# Patient Record
Sex: Male | Born: 1980 | Race: Black or African American | Hispanic: No | Marital: Single | Smoking: Former smoker
Health system: Southern US, Community
[De-identification: ages and names within clinical notes are randomized; demographics above are authoritative.]

## PROBLEM LIST (undated history)

## (undated) DIAGNOSIS — I10 Essential (primary) hypertension: Secondary | ICD-10-CM

## (undated) DIAGNOSIS — K319 Disease of stomach and duodenum, unspecified: Secondary | ICD-10-CM

## (undated) HISTORY — DX: Disease of stomach and duodenum, unspecified: K31.9

## (undated) HISTORY — DX: Essential (primary) hypertension: I10

---

## 2005-02-09 ENCOUNTER — Emergency Department (HOSPITAL_COMMUNITY): Admission: EM | Admit: 2005-02-09 | Discharge: 2005-02-10 | Payer: Self-pay | Admitting: Emergency Medicine

## 2006-01-12 ENCOUNTER — Emergency Department (HOSPITAL_COMMUNITY): Admission: EM | Admit: 2006-01-12 | Discharge: 2006-01-13 | Payer: Self-pay | Admitting: Emergency Medicine

## 2006-01-14 ENCOUNTER — Emergency Department (HOSPITAL_COMMUNITY): Admission: EM | Admit: 2006-01-14 | Discharge: 2006-01-14 | Payer: Self-pay | Admitting: Emergency Medicine

## 2013-03-08 ENCOUNTER — Encounter (HOSPITAL_BASED_OUTPATIENT_CLINIC_OR_DEPARTMENT_OTHER): Payer: Self-pay

## 2013-03-08 ENCOUNTER — Emergency Department (HOSPITAL_BASED_OUTPATIENT_CLINIC_OR_DEPARTMENT_OTHER)
Admission: EM | Admit: 2013-03-08 | Discharge: 2013-03-08 | Disposition: A | Discharge status: Home / Self Care | Payer: Self-pay | Attending: Emergency Medicine | Admitting: Emergency Medicine

## 2013-03-08 ENCOUNTER — Emergency Department (HOSPITAL_BASED_OUTPATIENT_CLINIC_OR_DEPARTMENT_OTHER): Payer: Self-pay

## 2013-03-08 DIAGNOSIS — F17200 Nicotine dependence, unspecified, uncomplicated: Secondary | ICD-10-CM | POA: Insufficient documentation

## 2013-03-08 DIAGNOSIS — R079 Chest pain, unspecified: Secondary | ICD-10-CM

## 2013-03-08 DIAGNOSIS — R42 Dizziness and giddiness: Secondary | ICD-10-CM | POA: Insufficient documentation

## 2013-03-08 DIAGNOSIS — R0789 Other chest pain: Secondary | ICD-10-CM | POA: Insufficient documentation

## 2013-03-08 LAB — COMPREHENSIVE METABOLIC PANEL
ALT: 23 U/L (ref 0–53)
AST: 23 U/L (ref 0–37)
Albumin: 3.6 g/dL (ref 3.5–5.2)
Alkaline Phosphatase: 74 U/L (ref 39–117)
GFR calc Af Amer: >90 mL/min (ref >90)
Glucose, Bld: 127 mg/dL — ABNORMAL HIGH (ref 70–99)
Potassium: 3.4 mEq/L — ABNORMAL LOW (ref 3.5–5.1)
Sodium: 141 mEq/L (ref 135–145)
Total Protein: 7.2 g/dL (ref 6.0–8.3)

## 2013-03-08 LAB — CBC WITH DIFFERENTIAL/PLATELET
Basophils Absolute: 0 10*3/uL (ref 0.0–0.1)
Eosinophils Absolute: 0.2 10*3/uL (ref 0.0–0.7)
Lymphs Abs: 2.4 10*3/uL (ref 0.7–4.0)
MCH: 27.4 pg (ref 26.0–34.0)
Neutrophils Relative %: 48 % (ref 43–77)
Platelets: 254 10*3/uL (ref 150–400)
RBC: 5.29 MIL/uL (ref 4.22–5.81)
RDW: 13.9 % (ref 11.5–15.5)
WBC: 5.9 10*3/uL (ref 4.0–10.5)

## 2013-03-08 NOTE — ED Notes (Addendum)
C/o lightheaded, dizziness and CP started while driving home after [id number] gait into triage/talking on cell phone-NAD

## 2013-03-08 NOTE — ED Provider Notes (Signed)
History     CSN: [id number]  Arrival date & time 03/08/13  1946   First MD Initiated Contact with Patient 03/08/13 2059      Chief Complaint  Patient presents with  . Dizziness  . Chest Pain    (Consider location/radiation/quality/duration/timing/severity/associated sxs/prior treatment) HPI Comments: Patient started with feelings of dizziness, chest discomfort for the past several hours.  It started while driving home from work but has since resolved.  No fever, chills or cough.    Patient is a 32 y.o. male presenting with chest pain. The history is provided by the patient.  Chest Pain Pain location:  Substernal area Pain quality: pressure   Pain radiates to:  Does not radiate Pain radiates to the back: no   Pain severity:  Mild Onset quality:  Sudden Duration:  1 hour Timing:  Constant Progression:  Resolved Chronicity:  New Relieved by:  Nothing Worsened by:  Nothing tried Ineffective treatments:  None tried   History reviewed. No pertinent past medical history.  History reviewed. No pertinent past surgical history.  No family history on file.  History  Substance Use Topics  . Smoking status: Current Every Day Smoker  . Smokeless tobacco: Not on file  . Alcohol Use: No      Review of Systems  Cardiovascular: Positive for chest pain.  All other systems reviewed and are negative.    Allergies  Review of patient's allergies indicates no known allergies.  Home Medications  No current outpatient prescriptions on file.  BP 176/85  Pulse 71  Temp(Src) 98.2 F (36.8 C) (Oral)  Resp 20  Ht 5\' 5"  (1.651 m)  Wt 245 lb (111.131 kg)  BMI 40.77 kg/m2  SpO2 98%  Physical Exam  Nursing note and vitals reviewed. Constitutional: He is oriented to person, place, and time. He appears well-developed and well-nourished. No distress.  HENT:  Head: Normocephalic and atraumatic.  Mouth/Throat: Oropharynx is clear and moist.  Neck: Normal range of motion. Neck  supple.  Cardiovascular: Normal rate, regular rhythm and normal heart sounds.   No murmur heard. Pulmonary/Chest: Effort normal and breath sounds normal. No respiratory distress. He has no wheezes. He has no rales. He exhibits no tenderness.  Abdominal: Soft. Bowel sounds are normal.  Musculoskeletal: Normal range of motion. He exhibits no edema.  Neurological: He is alert and oriented to person, place, and time.  Skin: Skin is warm and dry. He is not diaphoretic.    ED Course  Procedures (including critical care time)  Labs Reviewed  CBC WITH DIFFERENTIAL  COMPREHENSIVE METABOLIC PANEL  TROPONIN I   No results found.   No diagnosis found.   Date: 03/08/2013  Rate: 69  Rhythm: normal sinus rhythm  QRS Axis: normal  Intervals: normal  ST/T Wave abnormalities: normal  Conduction Disutrbances:none  Narrative Interpretation:   Old EKG Reviewed: none available    MDM  The labs, ekg, and chest xray are unremarkable.  The complaints do not sound cardiac in nature.  I suspect a musculoskeletal etiology.  Will discharge to home with nsaids, return prn.        Geoffery Lyons, MD 03/08/13 2234

## 2013-03-08 NOTE — ED Notes (Signed)
Patient transported to X-ray 

## 2013-08-02 ENCOUNTER — Emergency Department (HOSPITAL_COMMUNITY)
Admission: EM | Admit: 2013-08-02 | Discharge: 2013-08-02 | Disposition: A | Discharge status: Home / Self Care | Payer: Self-pay | Attending: Emergency Medicine | Admitting: Emergency Medicine

## 2013-08-02 ENCOUNTER — Emergency Department (HOSPITAL_COMMUNITY): Payer: Self-pay

## 2013-08-02 ENCOUNTER — Encounter (HOSPITAL_COMMUNITY): Payer: Self-pay | Admitting: Emergency Medicine

## 2013-08-02 DIAGNOSIS — Z7982 Long term (current) use of aspirin: Secondary | ICD-10-CM | POA: Insufficient documentation

## 2013-08-02 DIAGNOSIS — R062 Wheezing: Secondary | ICD-10-CM | POA: Insufficient documentation

## 2013-08-02 DIAGNOSIS — R0789 Other chest pain: Secondary | ICD-10-CM | POA: Insufficient documentation

## 2013-08-02 DIAGNOSIS — F17200 Nicotine dependence, unspecified, uncomplicated: Secondary | ICD-10-CM | POA: Insufficient documentation

## 2013-08-02 LAB — CBC
HCT: 43.3 % (ref 39.0–52.0)
Hemoglobin: 15.2 g/dL (ref 13.0–17.0)
MCH: 28.1 pg (ref 26.0–34.0)
MCHC: 35.1 g/dL (ref 30.0–36.0)
MCV: 80 fL (ref 78.0–100.0)

## 2013-08-02 LAB — BASIC METABOLIC PANEL
BUN: 10 mg/dL (ref 6–23)
CO2: 25 mEq/L (ref 19–32)
Calcium: 9.6 mg/dL (ref 8.4–10.5)
Creatinine, Ser: 0.99 mg/dL (ref 0.50–1.35)
GFR calc non Af Amer: >90 mL/min (ref >90)
Glucose, Bld: 113 mg/dL — ABNORMAL HIGH (ref 70–99)

## 2013-08-02 MED ORDER — ALBUTEROL SULFATE (5 MG/ML) 0.5% IN NEBU
5.0000 mg | INHALATION_SOLUTION | Freq: Once | RESPIRATORY_TRACT | Status: AC
  Administered 2013-08-02: 5 mg via RESPIRATORY_TRACT
  Filled 2013-08-02: qty 1

## 2013-08-02 NOTE — ED Provider Notes (Signed)
CSN: [id number]     Arrival date & time 08/02/13  1708 History   First MD Initiated Contact with Patient 08/02/13 2150     Chief Complaint  Patient presents with  . Chest Pain   (Consider location/radiation/quality/duration/timing/severity/associated sxs/prior Treatment) HPI Comments: 32 yo with smoking hx, no blood clot or cardiac hx presents with intermittent chest tightness for two weeks.  Non exertional, no diaphoresis.  No specific worsening features.  Non radiating. No lung dz.  Lasts seconds to a few minutes.  Patient denies blood clot history, active cancer, recent major trauma or surgery, unilateral leg swelling/ pain, recent long travel, hemoptysis or oral contraceptives. No cp for the past 3 hrs.    Patient is a 32 y.o. male presenting with chest pain. The history is provided by the patient.  Chest Pain Associated symptoms: no abdominal pain, no back pain, no cough, no fever, no headache, no shortness of breath and not vomiting     History reviewed. No pertinent past medical history. History reviewed. No pertinent past surgical history. History reviewed. No pertinent family history. History  Substance Use Topics  . Smoking status: Current Every Day Smoker  . Smokeless tobacco: Not on file  . Alcohol Use: No    Review of Systems  Constitutional: Negative for fever and chills.  HENT: Negative for congestion.   Eyes: Negative for visual disturbance.  Respiratory: Positive for wheezing. Negative for cough and shortness of breath.   Cardiovascular: Positive for chest pain.  Gastrointestinal: Negative for vomiting and abdominal pain.  Genitourinary: Negative for dysuria and flank pain.  Musculoskeletal: Negative for back pain, neck pain and neck stiffness.  Skin: Negative for rash.  Neurological: Negative for light-headedness and headaches.    Allergies  Review of patient's allergies indicates no known allergies.  Home Medications   Current Outpatient Rx  Name   Route  Sig  Dispense  Refill  . aspirin 81 MG tablet   Oral   Take 81 mg by mouth daily.          BP 142/80  Pulse 73  Temp(Src) 97.7 F (36.5 C) (Oral)  Resp 20  Wt 244 lb 1 oz (110.706 kg)  BMI 40.61 kg/m2  SpO2 95% Physical Exam  Nursing note and vitals reviewed. Constitutional: He is oriented to person, place, and time. He appears well-developed and well-nourished.  HENT:  Head: Normocephalic and atraumatic.  Eyes: Conjunctivae are normal. Right eye exhibits no discharge. Left eye exhibits no discharge.  Neck: Normal range of motion. Neck supple. No tracheal deviation present.  Cardiovascular: Normal rate, regular rhythm and intact distal pulses.   No murmur heard. Pulmonary/Chest: Effort normal. He has wheezes (mild exp bilateral).  Abdominal: Soft. He exhibits no distension. There is no tenderness. There is no guarding.  Musculoskeletal: He exhibits no edema and no tenderness.  Neurological: He is alert and oriented to person, place, and time. No cranial nerve deficit.  Skin: Skin is warm. No rash noted.  Psychiatric: He has a normal mood and affect.    ED Course  Procedures (including critical care time) Labs Review Labs Reviewed  CBC  BASIC METABOLIC PANEL  POCT I-STAT TROPONIN I   Imaging Review Dg Chest 2 View (if Patient Has Fever And/or Copd)  08/02/2013   CLINICAL DATA:  Chest pain, shortness of Breath.  EXAM: CHEST  2 VIEW  COMPARISON:  03/08/2013  FINDINGS: The heart size and mediastinal contours are within normal limits. Both lungs are clear. The visualized  skeletal structures are unremarkable. No effusion.  IMPRESSION: No active cardiopulmonary disease.   Electronically Signed   By: Oley Balm M.D.   On: 08/02/2013 18:06    EKG Interpretation     Ventricular Rate:  77 PR Interval:  132 QRS Duration: 82 QT Interval:  402 QTC Calculation: 454 R Axis:   73 Text Interpretation:  Normal sinus rhythm Normal ECG No acute findings, similar  previous            MDM  No diagnosis found. Well appearing.  Very atypical cp.  Very low risk cad, very low risk PE. No indication for further eval at this time in ED. Close fup outpt, if continues pt may need stress test.  EKG no acute findings.  Results and differential diagnosis were discussed with the patient. Close follow up outpatient was discussed, patient comfortable with the plan.   Diagnosis: Chest tightness   Enid Skeens, MD 08/02/13 2214

## 2013-08-02 NOTE — ED Notes (Signed)
Presents with intermittent chest pain and dizziness for the past 2 weeks associated with SOB and chest tightness, generalized to upper chest. Pain does not radiate. Nothing makes pain better, nothing makses pain worse. Bilateral expiratory wheezes auscultated.  Denies pain at this time. Recently quit smoking.

## 2014-03-29 IMAGING — CR DG CHEST 2V
2 series · 2 of 2 positions shown · non-contrast
Comparison: 03/08/2013

CLINICAL DATA: Chest pain, shortness of Breath.

EXAM:
CHEST  2 VIEW

[w chest pa]
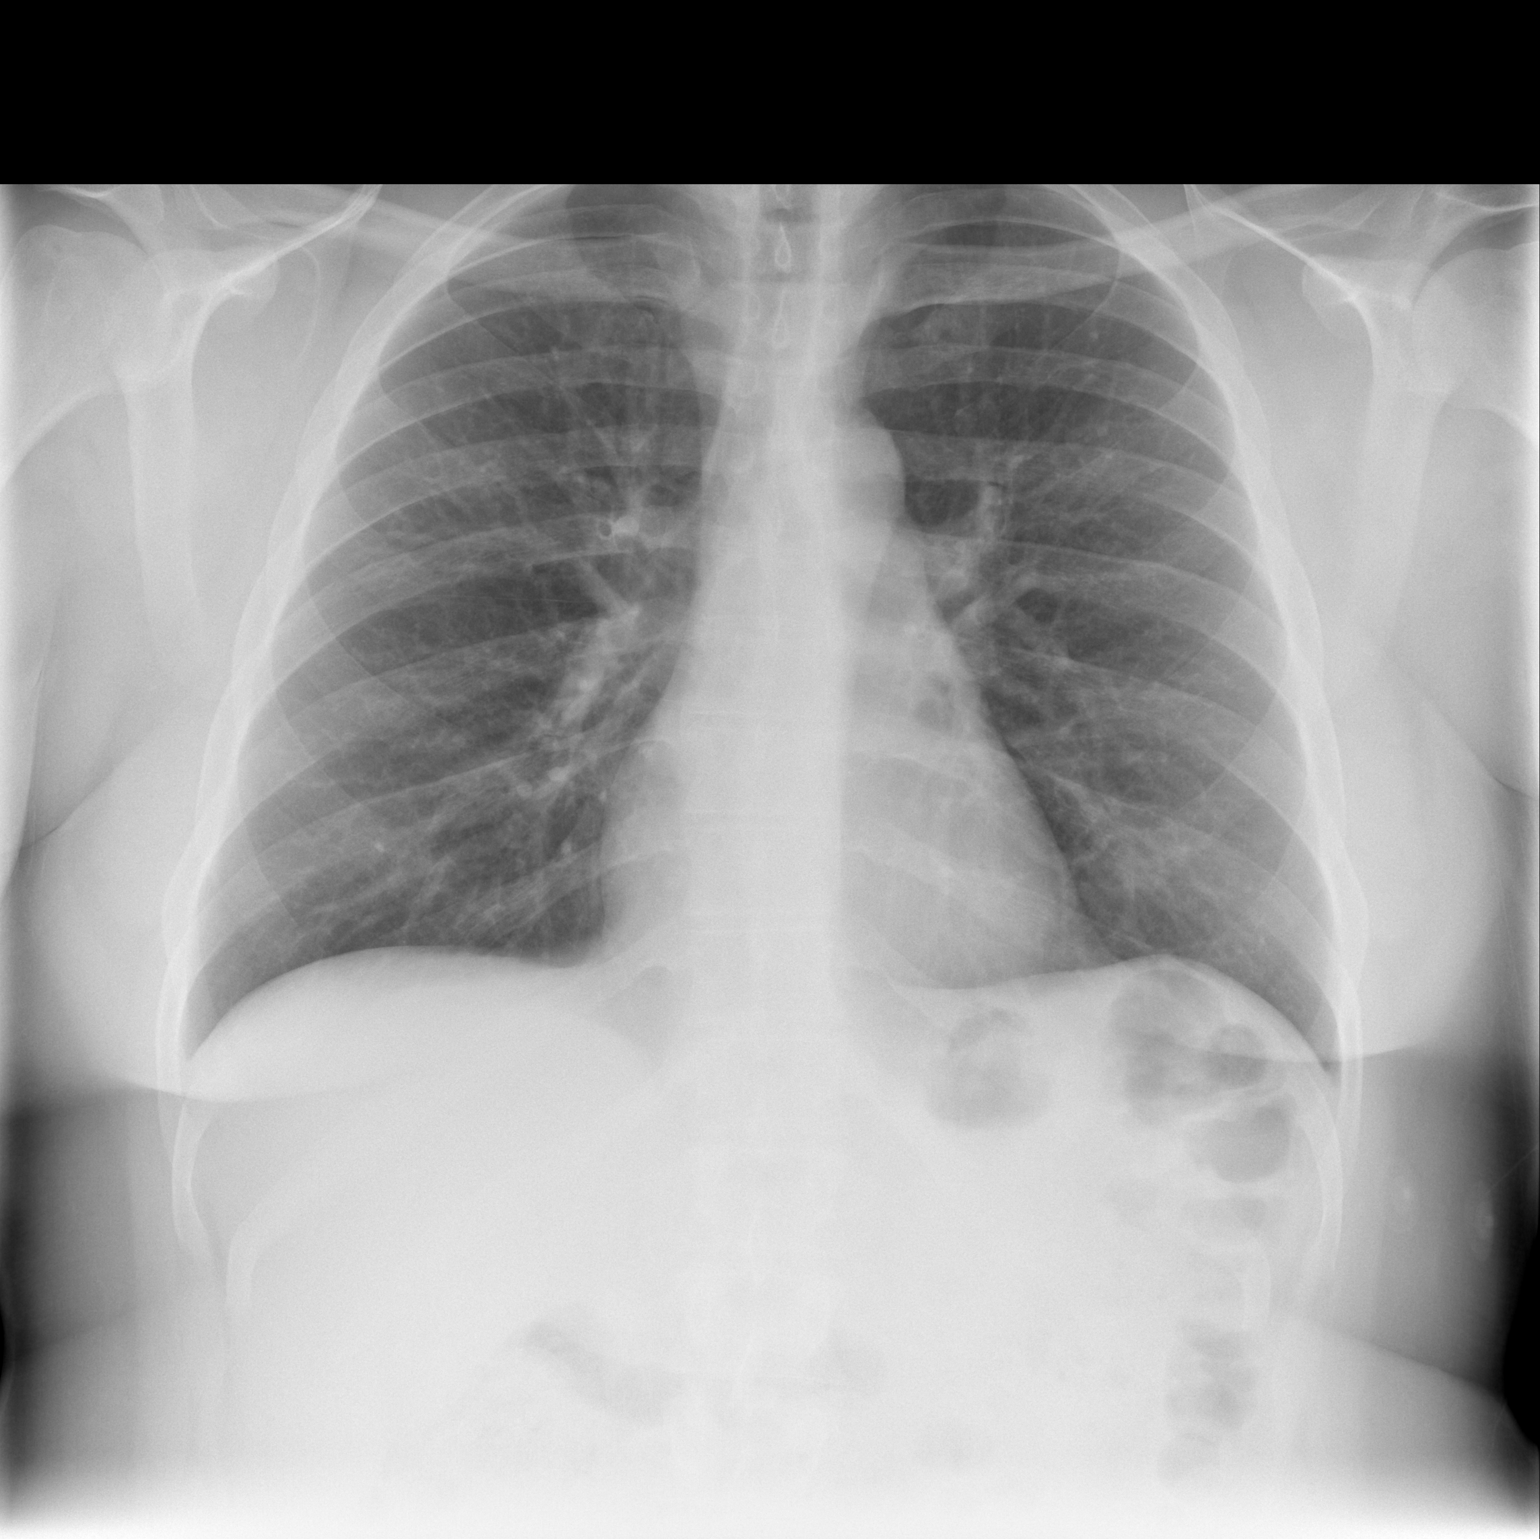

[w chest lat]
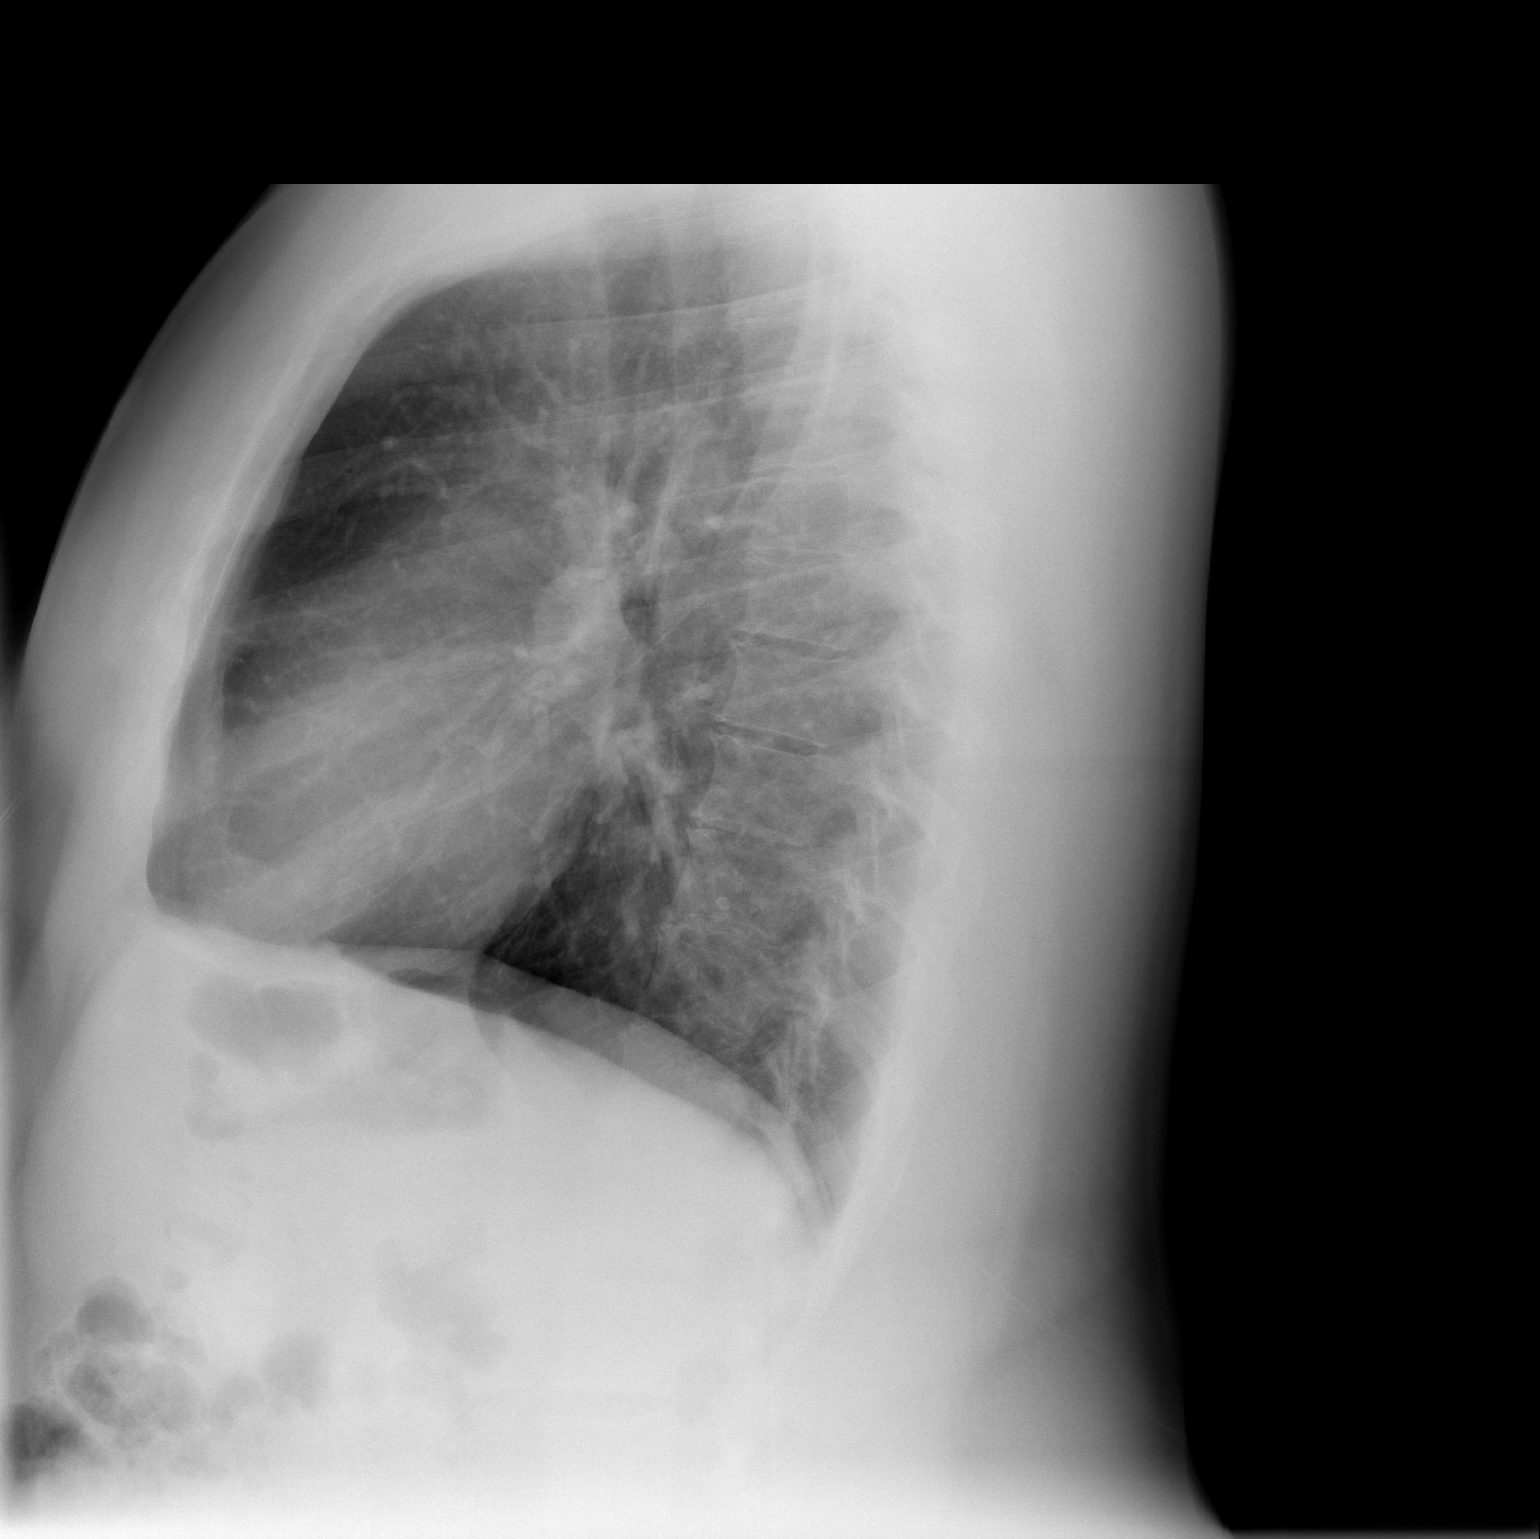

[2 of 2 positions shown; findings below may reference images not displayed]

FINDINGS: The heart size and mediastinal contours are within normal limits.
Both lungs are clear. The visualized skeletal structures are
unremarkable. No effusion.
IMPRESSION: No active cardiopulmonary disease.

## 2014-11-16 ENCOUNTER — Encounter (HOSPITAL_COMMUNITY): Payer: Self-pay | Admitting: Emergency Medicine

## 2014-11-16 ENCOUNTER — Emergency Department (HOSPITAL_COMMUNITY)
Admission: EM | Admit: 2014-11-16 | Discharge: 2014-11-16 | Disposition: A | Discharge status: Home / Self Care | Payer: No Typology Code available for payment source | Source: Home / Self Care | Attending: Family Medicine | Admitting: Family Medicine

## 2014-11-16 DIAGNOSIS — R42 Dizziness and giddiness: Secondary | ICD-10-CM

## 2014-11-16 NOTE — ED Notes (Addendum)
Pt states that he started feeling dizzy 2 hours ago along with some hyperventilation.

## 2014-11-16 NOTE — ED Provider Notes (Signed)
Lonnie Moore is a 34 y.o. male who presents to Urgent Care today for dizziness. Patient developed vague dizziness today. He describes an unstable feeling. He denies any vertigo blurry vision weakness or numbness. No presyncopal feelings. He denies any significant palpitations. He does note a week and a half of mild chest tightness. He denies any exertional chest pain or radiating pain. No fevers or chills. He has not tried any medications yet. He no longer is experiencing any dizziness chest pain or palpitations.   History reviewed. No pertinent past medical history. History reviewed. No pertinent past surgical history. History  Substance Use Topics  . Smoking status: Current Every Day Smoker  . Smokeless tobacco: Not on file  . Alcohol Use: No   ROS as above Medications: No current facility-administered medications for this encounter.   Current Outpatient Prescriptions  Medication Sig Dispense Refill  . aspirin 81 MG tablet Take 81 mg by mouth daily.     No Known Allergies   Exam:  BP 166/97 mmHg  Pulse 80  Temp(Src) 98.5 F (36.9 C) (Oral)  Resp 20  SpO2 99% Gen: Well NAD HEENT: EOMI,  MMM PERRLA Lungs: Normal work of breathing. CTABL Heart: RRR no MRG Abd: NABS, Soft. Nondistended, Nontender Exts: Brisk capillary refill, warm and well perfused. Neuro: Alert and oriented cranial nerves II through XII are intact normal coordination balance and sensation. Normal gait.   12-lead EKG shows sinus rhythm at 65 bpm. No ST segment elevation or depression. QTC 409. Normal-appearing EKG. no significant change from prior EKG  No results found for this or any previous visit (from the past 24 hour(s)). No results found.  Assessment and Plan: 34 y.o. male with vague dizziness and chest pain. Patient appears to be well. His EKG is essentially normal. Follow-up with cardiology for further risk factor stratification. Recommend follow-up with PCP in the near future.  Discussed  warning signs or symptoms. Please see discharge instructions. Patient expresses understanding.     Rodolph BongEvan S Daina Cara, MD 11/16/14 [phone]

## 2014-11-16 NOTE — Discharge Instructions (Signed)
Thank you for coming in today. Follow-up with cardiology Call and schedule an appointment Go to the emergency room if you get worse Call or go to the emergency room if you get worse, have trouble breathing, have chest pains, or palpitations.    Dizziness Dizziness is a common problem. It is a feeling of unsteadiness or light-headedness. You may feel like you are about to faint. Dizziness can lead to injury if you stumble or fall. A person of any age group can suffer from dizziness, but dizziness is more common in older adults. CAUSES  Dizziness can be caused by many different things, including:  Middle ear problems.  Standing for too long.  Infections.  An allergic reaction.  Aging.  An emotional response to something, such as the sight of blood.  Side effects of medicines.  Tiredness.  Problems with circulation or blood pressure.  Excessive use of alcohol or medicines, or illegal drug use.  Breathing too fast (hyperventilation).  An irregular heart rhythm (arrhythmia).  A low red blood cell count (anemia).  Pregnancy.  Vomiting, diarrhea, fever, or other illnesses that cause body fluid loss (dehydration).  Diseases or conditions such as Parkinson's disease, high blood pressure (hypertension), diabetes, and thyroid problems.  Exposure to extreme heat. DIAGNOSIS  Your health care provider will ask about your symptoms, perform a physical exam, and perform an electrocardiogram (ECG) to record the electrical activity of your heart. Your health care provider may also perform other heart or blood tests to determine the cause of your dizziness. These may include:  Transthoracic echocardiogram (TTE). During echocardiography, sound waves are used to evaluate how blood flows through your heart.  Transesophageal echocardiogram (TEE).  Cardiac monitoring. This allows your health care provider to monitor your heart rate and rhythm in real time.  Holter monitor. This is a  portable device that records your heartbeat and can help diagnose heart arrhythmias. It allows your health care provider to track your heart activity for several days if needed.  Stress tests by exercise or by giving medicine that makes the heart beat faster. TREATMENT  Treatment of dizziness depends on the cause of your symptoms and can vary greatly. HOME CARE INSTRUCTIONS   Drink enough fluids to keep your urine clear or pale yellow. This is especially important in very hot weather. In older adults, it is also important in cold weather.  Take your medicine exactly as directed if your dizziness is caused by medicines. When taking blood pressure medicines, it is especially important to get up slowly.  Rise slowly from chairs and steady yourself until you feel okay.  In the morning, first sit up on the side of the bed. When you feel okay, stand slowly while holding onto something until you know your balance is fine.  Move your legs often if you need to stand in one place for a long time. Tighten and relax your muscles in your legs while standing.  Have someone stay with you for 1-2 days if dizziness continues to be a problem. Do this until you feel you are well enough to stay alone. Have the person call your health care provider if he or she notices changes in you that are concerning.  Do not drive or use heavy machinery if you feel dizzy.  Do not drink alcohol. SEEK IMMEDIATE MEDICAL CARE IF:   Your dizziness or light-headedness gets worse.  You feel nauseous or vomit.  You have problems talking, walking, or using your arms, hands, or legs.  You feel weak.  You are not thinking clearly or you have trouble forming sentences. It may take a friend or family member to notice this.  You have chest pain, abdominal pain, shortness of breath, or sweating.  Your vision changes.  You notice any bleeding.  You have side effects from medicine that seems to be getting worse rather than  better. MAKE SURE YOU:   Understand these instructions.  Will watch your condition.  Will get help right away if you are not doing well or get worse. Document Released: 03/18/2001 Document Revised: 09/27/2013 Document Reviewed: 04/11/2011 [facility] Patient Information 2015 [place], [place]. This information is not intended to replace advice given to you by your health care provider. Make sure you discuss any questions you have with your health care provider.  Chest Pain (Nonspecific) It is often hard to give a specific diagnosis for the cause of chest pain. There is always a chance that your pain could be related to something serious, such as a heart attack or a blood clot in the lungs. You need to follow up with your health care provider for further evaluation. CAUSES   Heartburn.  Pneumonia or bronchitis.  Anxiety or stress.  Inflammation around your heart (pericarditis) or lung (pleuritis or pleurisy).  A blood clot in the lung.  A collapsed lung (pneumothorax). It can develop suddenly on its own (spontaneous pneumothorax) or from trauma to the chest.  Shingles infection (herpes zoster virus). The chest wall is composed of bones, muscles, and cartilage. Any of these can be the source of the pain.  The bones can be bruised by injury.  The muscles or cartilage can be strained by coughing or overwork.  The cartilage can be affected by inflammation and become sore (costochondritis). DIAGNOSIS  Lab tests or other studies may be needed to find the cause of your pain. Your health care provider may have you take a test called an ambulatory electrocardiogram (ECG). An ECG records your heartbeat patterns over a 24-hour period. You may also have other tests, such as:  Transthoracic echocardiogram (TTE). During echocardiography, sound waves are used to evaluate how blood flows through your heart.  Transesophageal echocardiogram (TEE).  Cardiac monitoring. This allows your health care  provider to monitor your heart rate and rhythm in real time.  Holter monitor. This is a portable device that records your heartbeat and can help diagnose heart arrhythmias. It allows your health care provider to track your heart activity for several days, if needed.  Stress tests by exercise or by giving medicine that makes the heart beat faster. TREATMENT   Treatment depends on what may be causing your chest pain. Treatment may include:  Acid blockers for heartburn.  Anti-inflammatory medicine.  Pain medicine for inflammatory conditions.  Antibiotics if an infection is present.  You may be advised to change lifestyle habits. This includes stopping smoking and avoiding alcohol, caffeine, and chocolate.  You may be advised to keep your head raised (elevated) when sleeping. This reduces the chance of acid going backward from your stomach into your esophagus. Most of the time, nonspecific chest pain will improve within 2-3 days with rest and mild pain medicine.  HOME CARE INSTRUCTIONS   If antibiotics were prescribed, take them as directed. Finish them even if you start to feel better.  For the next few days, avoid physical activities that bring on chest pain. Continue physical activities as directed.  Do not use any tobacco products, including cigarettes, chewing tobacco, or electronic cigarettes.  Avoid drinking alcohol.  Only take medicine as directed by your health care provider.  Follow your health care provider's suggestions for further testing if your chest pain does not go away.  Keep any follow-up appointments you made. If you do not go to an appointment, you could develop lasting (chronic) problems with pain. If there is any problem keeping an appointment, call to reschedule. SEEK MEDICAL CARE IF:   Your chest pain does not go away, even after treatment.  You have a rash with blisters on your chest.  You have a fever. SEEK IMMEDIATE MEDICAL CARE IF:   You have  increased chest pain or pain that spreads to your arm, neck, jaw, back, or abdomen.  You have shortness of breath.  You have an increasing cough, or you cough up blood.  You have severe back or abdominal pain.  You feel nauseous or vomit.  You have severe weakness.  You faint.  You have chills. This is an emergency. Do not wait to see if the pain will go away. Get medical help at once. Call your local emergency services (911 in [place] Do not drive yourself to the hospital. MAKE SURE YOU:   Understand these instructions.  Will watch your condition.  Will get help right away if you are not doing well or get worse. Document Released: 07/02/2005 Document Revised: 09/27/2013 Document Reviewed: 04/27/2008 [facility] Patient Information 2015 [place], [place]. This information is not intended to replace advice given to you by your health care provider. Make sure you discuss any questions you have with your health care provider.

## 2014-12-12 ENCOUNTER — Institutional Professional Consult (permissible substitution): Payer: No Typology Code available for payment source | Admitting: Internal Medicine

## 2014-12-12 ENCOUNTER — Ambulatory Visit (INDEPENDENT_AMBULATORY_CARE_PROVIDER_SITE_OTHER): Payer: No Typology Code available for payment source | Admitting: Internal Medicine

## 2014-12-12 ENCOUNTER — Encounter: Payer: Self-pay | Admitting: Internal Medicine

## 2014-12-12 VITALS — BP 138/90 | HR 61 | Ht 65.0 in | Wt 245.7 lb

## 2014-12-12 DIAGNOSIS — R42 Dizziness and giddiness: Secondary | ICD-10-CM | POA: Insufficient documentation

## 2014-12-12 DIAGNOSIS — R0789 Other chest pain: Secondary | ICD-10-CM

## 2014-12-12 DIAGNOSIS — F419 Anxiety disorder, unspecified: Secondary | ICD-10-CM

## 2014-12-12 DIAGNOSIS — K219 Gastro-esophageal reflux disease without esophagitis: Secondary | ICD-10-CM

## 2014-12-12 NOTE — Progress Notes (Signed)
OFFICE NOTE  Chief Complaint:  Follow-up urgent care visit  Primary Care Physician: No PCP Per Patient  HPI:  Lonnie Moore is a pleasant 34 year old male who was seen in February at urgent care for an episode of dizziness and some associated chest discomfort. He reports the dizziness is transient and was not a sensation like the room was spinning. He denies any presyncope or syncopal episodes. With regards to his chest discomfort he says it is sometimes worse after meals and worse when laying down at night. The symptoms tend to be in the center of the chest and radiated to both sides and oftentimes to the back. He said it does feel somewhat like his heartburn symptoms he's had and does get relief with taking Pepto-Bismol. He has not been on any long-term antacid medications. Recently he's been trying to decrease spicy foods and fatty foods in his diet. He denies any chest pain with exertion. In fact he's been moving to a different residence over the past several weeks and has been lifting a lot of heavy boxes and furniture without any chest pain or worsening shortness of breath. EKG in the office today is normal.  PMHx:  Past Medical History  Diagnosis Date  . Hypertension   . Stomach problems     No past surgical history on file.  FAMHx:  Family History  Problem Relation Age of Onset  . Stroke Father     SOCHx:   reports that he quit smoking about 16 months ago. His smoking use included Cigarettes. He has a 5 pack-year smoking history. He does not have any smokeless tobacco history on file. He reports that he does not drink alcohol. His drug history is not on file.  ALLERGIES:  No Known Allergies  ROS: A comprehensive review of systems was negative except for: Cardiovascular: positive for chest pain Neurological: positive for dizziness  HOME MEDS: Current Outpatient Prescriptions  Medication Sig Dispense Refill  . aspirin 81 MG tablet Take 81 mg by mouth daily.      No current facility-administered medications for this visit.    LABS/IMAGING: No results found for this or any previous visit (from the past 48 hour(s)). No results found.  VITALS: BP 138/90 mmHg  Pulse 61  Ht 5\' 5"  (1.651 m)  Wt 245 lb 11.2 oz (111.449 kg)  BMI 40.89 kg/m2  EXAM: General appearance: alert and no distress Neck: no JVD and supple, symmetrical, trachea midline Lungs: clear to auscultation bilaterally Heart: regular rate and rhythm, S1, S2 normal, no murmur, click, rub or gallop Abdomen: soft, non-tender; bowel sounds normal; no masses,  no organomegaly Extremities: extremities normal, atraumatic, no cyanosis or edema Pulses: 2+ and symmetric Skin: Skin color, texture, turgor normal. No rashes or lesions Neurologic: Alert and oriented X 3, normal strength and tone. Normal symmetric reflexes. Normal coordination and gait Psych: Pleasant  EKG: Normal sinus rhythm at 61  ASSESSMENT: 1. Atypical chest pain, likely GERD 2. Anxiety 3. Transient dizziness  PLAN: 1.   Mr. Briant SitesGoods had some transient dizziness which is worsened by anxiety and atypical chest discomfort which most likely is GERD. I counseled him on ways to decrease his reflux including avoiding certain foods, staying upright or walking after eating, not eating late at night and going to sleep, increasing his exercise and working on weight loss. None of his symptoms sound like angina and are not associated with heavy exertion. He could also consider over-the-counter Maalox or Pepcid/Zantac as needed for  heartburn symptoms.  I'm happy to see him back on an as-needed basis, however no further cardiac testing is necessary at this time.  Chrystie Nose, MD, [facility] Attending Cardiologist [facility]  Jacobo Moncrief C 12/12/2014, 12:45 PM

## 2014-12-12 NOTE — Patient Instructions (Signed)
Your physician recommends that you schedule a follow-up appointment as needed  

## 2015-09-26 ENCOUNTER — Encounter (HOSPITAL_COMMUNITY): Payer: Self-pay | Admitting: *Deleted

## 2015-09-26 ENCOUNTER — Emergency Department (HOSPITAL_COMMUNITY)
Admission: EM | Admit: 2015-09-26 | Discharge: 2015-09-26 | Disposition: A | Discharge status: Home / Self Care | Payer: No Typology Code available for payment source | Attending: Emergency Medicine | Admitting: Emergency Medicine

## 2015-09-26 DIAGNOSIS — I10 Essential (primary) hypertension: Secondary | ICD-10-CM | POA: Insufficient documentation

## 2015-09-26 DIAGNOSIS — K0889 Other specified disorders of teeth and supporting structures: Secondary | ICD-10-CM | POA: Insufficient documentation

## 2015-09-26 DIAGNOSIS — Z87891 Personal history of nicotine dependence: Secondary | ICD-10-CM | POA: Insufficient documentation

## 2015-09-26 DIAGNOSIS — Z8719 Personal history of other diseases of the digestive system: Secondary | ICD-10-CM | POA: Insufficient documentation

## 2015-09-26 DIAGNOSIS — Z7982 Long term (current) use of aspirin: Secondary | ICD-10-CM | POA: Insufficient documentation

## 2015-09-26 DIAGNOSIS — K002 Abnormalities of size and form of teeth: Secondary | ICD-10-CM | POA: Insufficient documentation

## 2015-09-26 MED ORDER — PENICILLIN V POTASSIUM 500 MG PO TABS: 500.0000 mg | ORAL_TABLET | Freq: Four times a day (QID) | ORAL | Status: AC

## 2015-09-26 NOTE — ED Notes (Signed)
See PA assessment 

## 2015-09-26 NOTE — Discharge Instructions (Signed)

## 2015-09-26 NOTE — ED Provider Notes (Signed)
CSN: [id number]     Arrival date & time 09/26/15  1530 History  By signing my name below, I, Lonnie Moore, attest that this documentation has been prepared under the direction and in the presence of Roxy Horseman, PA-C. Electronically Signed: Budd Moore, ED Scribe. 09/26/2015. 4:10 PM.    Chief Complaint  Patient presents with  . Dental Pain   The history is provided by the patient. No language interpreter was used.   HPI Comments: Lonnie Moore is a 34 y.o. male with a PMHx of HTN who presents to the Emergency Department complaining of constant, aching, lower right-sided dental pain onset 3 days ago, worsening significantly as of yesterday. He currently rates his pain as a 7.5/10. He reports associated swelling to the gums, as well as a feeling of warmth to the right ear. He states he has tried taking tylenol with relief for 5-10 minutes at the most. He notes he does not have a Education officer, community or insurance. Pt denies fever and chills.  Pt has NKDA.  Past Medical History  Diagnosis Date  . Hypertension   . Stomach problems    History reviewed. No pertinent past surgical history. Family History  Problem Relation Age of Onset  . Stroke Father    Social History  Substance Use Topics  . Smoking status: Former Smoker -- 0.50 packs/day for 10 years    Types: Cigarettes    Quit date: 07/31/2013  . Smokeless tobacco: None  . Alcohol Use: No    Review of Systems  Constitutional: Negative for fever and chills.  HENT: Positive for dental problem.     Allergies  Review of patient's allergies indicates no known allergies.  Home Medications   Prior to Admission medications   Medication Sig Start Date End Date Taking? Authorizing Provider  aspirin 81 MG tablet Take 81 mg by mouth daily.    Historical Provider, MD  penicillin v potassium (VEETID) 500 MG tablet Take 1 tablet (500 mg total) by mouth 4 (four) times daily. 09/26/15   Roxy Horseman, PA-C   BP 165/97 mmHg   Pulse 72  Temp(Src) 98.1 F (36.7 C) (Oral)  Resp 18  SpO2 96% Physical Exam  Constitutional: He is oriented to person, place, and time. He appears well-developed and well-nourished.  HENT:  Head: Normocephalic and atraumatic.  Mouth/Throat:    Poor dentition throughout.  Affected tooth as diagrammed.  No signs of peritonsillar or tonsillar abscess.  No signs of gingival abscess. Oropharynx is clear and without exudates.  Uvula is midline.  Airway is intact. No signs of Ludwig's angina with palpation of oral and sublingual mucosa.  Eyes: Conjunctivae are normal. Right eye exhibits no discharge. Left eye exhibits no discharge.  Pulmonary/Chest: Effort normal. No respiratory distress.  Neurological: He is alert and oriented to person, place, and time. Coordination normal.  Skin: Skin is warm and dry. No rash noted. He is not diaphoretic. No erythema.  Psychiatric: He has a normal mood and affect.  Nursing note and vitals reviewed.   ED Course  Procedures  DIAGNOSTIC STUDIES: Oxygen Saturation is 96% on RA, adequate by my interpretation.    COORDINATION OF CARE: 4:05 PM - Discussed plans to order antibiotics. Will refer to a dentist. Pt advised of plan for treatment and pt agrees.   MDM   Final diagnoses:  Pain, dental  Patient with toothache.  No gross abscess.  Exam unconcerning for Ludwig's angina or spread of infection.  Will treat with penicillin and OTC  pain medicine.  Urged patient to follow-up with dentist.    I personally performed the services described in this documentation, which was scribed in my presence. The recorded information has been reviewed and is accurate.     Roxy Horsemanobert Paydon Carll, PA-C 09/26/15 1612  Lyndal Pulleyaniel Knott, MD 09/27/15 [phone]

## 2015-09-26 NOTE — ED Notes (Signed)
Pt reports dental abscess to right tooth, having pain into jaw and ear. Denies fever. Airway intact.

## 2023-05-10 ENCOUNTER — Ambulatory Visit
Admission: EM | Admit: 2023-05-10 | Discharge: 2023-05-10 | Disposition: A | Discharge status: Home / Self Care | Payer: BC Managed Care – PPO | Attending: Physician Assistant | Admitting: Physician Assistant

## 2023-05-10 ENCOUNTER — Encounter: Payer: Self-pay | Admitting: Emergency Medicine

## 2023-05-10 ENCOUNTER — Other Ambulatory Visit: Payer: Self-pay

## 2023-05-10 DIAGNOSIS — J029 Acute pharyngitis, unspecified: Secondary | ICD-10-CM | POA: Diagnosis not present

## 2023-05-10 DIAGNOSIS — Z1152 Encounter for screening for COVID-19: Secondary | ICD-10-CM | POA: Diagnosis not present

## 2023-05-10 LAB — POCT RAPID STREP A (OFFICE): Rapid Strep A Screen: NEGATIVE

## 2023-05-10 NOTE — ED Provider Notes (Addendum)
[facility]    CSN: [id number] Arrival date & time: 05/10/23  1135      History   Chief Complaint Chief Complaint  Patient presents with   Sore Throat    HPI Lonnie Moore is a 42 y.o. male.   Patient here today for evaluation of sore throat, congestion, body aches he has had for 2 days.  He has had some fever.  He has not taken any medication at home.  He denies any vomiting or diarrhea.  The history is provided by the patient.  Sore Throat Pertinent negatives include no abdominal pain and no shortness of breath.    Past Medical History:  Diagnosis Date   Hypertension    Stomach problems     Patient Active Problem List   Diagnosis Date Noted   GERD (gastroesophageal reflux disease) 12/12/2014   Dizziness 12/12/2014   Anxiety 12/12/2014    History reviewed. No pertinent surgical history.     Home Medications    Prior to Admission medications   Medication Sig Start Date End Date Taking? Authorizing Provider  aspirin 81 MG tablet Take 81 mg by mouth daily.    [provider]  penicillin v potassium (VEETID) 500 MG tablet Take 1 tablet (500 mg total) by mouth 4 (four) times daily. 09/26/15   Roxy Horseman, PA-C    Family History Family History  Problem Relation Age of Onset   Stroke Father     Social History Social History   Tobacco Use   Smoking status: Former    Current packs/day: 0.00    Average packs/day: 0.5 packs/day for 10.0 years (5.0 ttl pk-yrs)    Types: Cigarettes    Start date: 08/01/2003    Quit date: 07/31/2013    Years since quitting: 9.7  Substance Use Topics   Alcohol use: No    Alcohol/week: 0.0 standard drinks of alcohol     Allergies   Patient has no known allergies.   Review of Systems Review of Systems  Constitutional:  Positive for chills and fever.  HENT:  Positive for congestion and sore throat. Negative for ear pain.   Eyes:  Negative for discharge and redness.  Respiratory:   Negative for cough and shortness of breath.   Gastrointestinal:  Negative for abdominal pain, diarrhea, nausea and vomiting.  Musculoskeletal:  Positive for myalgias.     Physical Exam Triage Vital Signs ED Triage Vitals [05/10/23 1152]  Encounter Vitals Group     BP (!) 178/102     Systolic BP Percentile      Diastolic BP Percentile      Pulse Rate 91     Resp 18     Temp 100 F (37.8 C)     Temp Source Oral     SpO2 96 %     Weight      Height      Head Circumference      Peak Flow      Pain Score 7     Pain Loc      Pain Education      Exclude from Growth Chart    No data found.  Updated Vital Signs BP (!) 178/102 (BP Location: Left Arm)   Pulse 91   Temp 100 F (37.8 C) (Oral)   Resp 18   SpO2 96%      Physical Exam Vitals and nursing note reviewed.  Constitutional:      General: He is not in acute distress.  Appearance: Normal appearance. He is not ill-appearing.  HENT:     Head: Normocephalic and atraumatic.     Nose: Congestion present.     Mouth/Throat:     Mouth: Mucous membranes are moist.     Pharynx: Oropharynx is clear. Posterior oropharyngeal erythema present. No oropharyngeal exudate.  Eyes:     Conjunctiva/sclera: Conjunctivae normal.  Cardiovascular:     Rate and Rhythm: Normal rate and regular rhythm.     Heart sounds: Normal heart sounds. No murmur heard. Pulmonary:     Effort: Pulmonary effort is normal. No respiratory distress.     Breath sounds: Normal breath sounds. No wheezing, rhonchi or rales.  Skin:    General: Skin is warm and dry.  Neurological:     Mental Status: He is alert.  Psychiatric:        Mood and Affect: Mood normal.        Thought Content: Thought content normal.      UC Treatments / Results  Labs (all labs ordered are listed, but only abnormal results are displayed) Labs Reviewed  SARS CORONAVIRUS 2 (TAT 6-24 HRS)  POCT RAPID STREP A (OFFICE)    EKG   Radiology No results  found.  Procedures Procedures (including critical care time)  Medications Ordered in UC Medications - No data to display  Initial Impression / Assessment and Plan / UC Course  I have reviewed the triage vital signs and the nursing notes.  Pertinent labs & imaging results that were available during my care of the patient were reviewed by me and considered in my medical decision making (see chart for details).    Suspect blood pressure is elevated due to illness.  Will order COVID screening as strep was negative.  Given prior history of peritonsillar abscess per patient recommended further evaluation in the emergency room with any worsening symptoms.  Patient is able to maintain secretions at this time.  Encouraged symptomatic treatment, increase fluids and rest.  Final Clinical Impressions(s) / UC Diagnoses   Final diagnoses:  Acute pharyngitis, unspecified etiology   Discharge Instructions   None    ED Prescriptions   None    PDMP not reviewed this encounter.   Tomi Bamberger, PA-C 05/10/23 1210    Tomi Bamberger, PA-C 05/10/23 1210

## 2023-05-10 NOTE — ED Triage Notes (Signed)
Pt here for sore throat x 2 days with body aches

## 2024-01-21 DIAGNOSIS — Z0279 Encounter for issue of other medical certificate: Secondary | ICD-10-CM
